# Patient Record
Sex: Male | Born: 1950 | Race: White | Hispanic: No | Marital: Married | Smoking: Never smoker
Health system: Southern US, Community
[De-identification: ages and names within clinical notes are randomized; demographics above are authoritative.]

## PROBLEM LIST (undated history)

## (undated) DIAGNOSIS — E119 Type 2 diabetes mellitus without complications: Secondary | ICD-10-CM

## (undated) DIAGNOSIS — M109 Gout, unspecified: Secondary | ICD-10-CM

## (undated) DIAGNOSIS — E7800 Pure hypercholesterolemia, unspecified: Secondary | ICD-10-CM

## (undated) DIAGNOSIS — I10 Essential (primary) hypertension: Secondary | ICD-10-CM

---

## 1988-11-15 HISTORY — PX: ANKLE CLOSED REDUCTION: SHX880

## 2001-11-15 DIAGNOSIS — Z9889 Other specified postprocedural states: Secondary | ICD-10-CM

## 2001-11-15 HISTORY — DX: Other specified postprocedural states: Z98.890

## 2001-11-15 HISTORY — PX: BACK SURGERY: SHX140

## 2021-03-27 ENCOUNTER — Other Ambulatory Visit: Payer: Self-pay | Admitting: Podiatry

## 2021-04-17 ENCOUNTER — Encounter (HOSPITAL_COMMUNITY): Payer: Self-pay

## 2021-04-17 NOTE — Patient Instructions (Addendum)
William Weiss  04/17/2021     @PREFPERIOPPHARMACY @   Your procedure is scheduled on  04/22/2021.   Report to 06/22/2021 at  [phone]  A.M.   Call this number if you have problems the morning of surgery:  [phone]   Remember:  Do not eat or drink after midnight.                        Take these medicines the morning of surgery with A SIP OF WATER  Allopurinol.  DO NOT take any medications for diabetes the morning of your procedure.    Please brush your teeth.  Do not wear jewelry, make-up or nail polish.  Do not wear lotions, powders, or perfumes, or deodorant.  Do not shave 48 hours prior to surgery.  Men may shave face and neck.  Do not bring valuables to the hospital.  [facility] is not responsible for any belongings or valuables.  Contacts, dentures or bridgework may not be worn into surgery.  Leave your suitcase in the car.  After surgery it may be brought to your room.  For patients admitted to the hospital, discharge time will be determined by your treatment team.  Patients discharged the day of surgery will not be allowed to drive home and musy have someone with them for 24 hours.   Special instructions:  DO NOT smoke tobacco or vape for 24 hours before your procedure.  Please read over the following fact sheets that you were given. Coughing and Deep Breathing, Surgical Site Infection Prevention, Anesthesia Post-op Instructions and Care and Recovery After Surgery       Surgical Wound Debridement, Care After This sheet gives you information about how to care for yourself after your procedure. Your health care provider may also give you more specific instructions. If you have problems or questions, contact your health care provider. What can I expect after the procedure? After the procedure, it is common to have:  Pain or soreness.  Fluid that leaks from the wound.  Stiffness.  A larger wound. This is because the dead tissue has been  removed. Follow these instructions at home: Medicines  Take over-the-counter and prescription medicines only as told by your health care provider.  If you were prescribed an antibiotic medicine, take it or apply it as told by your health care provider. Do not stop using the antibiotic even if you start to feel better. Wound care  Follow instructions from your health care provider about how to take care of your wound. Make sure you: ? Wash your hands with soap and water before and after you change your bandage (dressing). If soap and water are not available, use hand sanitizer. ? Change your dressing as told by your health care provider. ? If your dressing is dry and stuck when you try to remove it, moisten or wet the dressing with saline or water so that it can be removed without harming your skin or wound tissue.  Check your wound for signs of infection every time your dressing is changed. Have someone help you do this if you are not able. Watch for: ? More redness, swelling, or pain. ? More fluid or blood. ? Warmth. ? Pus. ? A bad smell coming from your wound even after you clean it.  Do not take baths, swim, or use a hot tub until your health care provider approves. Ask your health care provider if you  may take showers. You may only be allowed to take sponge baths. Activity  Do not drive or use heavy machinery while taking prescription pain medicine.  Return to your normal activities as told by your health care provider. Ask your health care provider what activities are safe for you. General instructions  Eat a healthy diet with lots of protein such as meats, cheese, nuts and beans. Ask your health care provider to suggest the best diet for you.  Do not use any products that contain nicotine or tobacco, such as cigarettes, e-cigarettes, and chewing tobacco. These can delay wound healing after surgery. If you need help quitting, ask your health care provider.  Keep all follow-up  visits as told by your health care provider. This is important.      Contact a health care provider if:  You have a fever.  Your pain medicine is not helping.  Your wound is more red and swollen.  You have increased bleeding.  You have pus coming from your wound.  You have a bad smell coming from your wound.  Your wound is not getting better after 1-2 weeks of treatment.  You develop a new medical condition, such as diabetes, peripheral vascular disease, or conditions that affect your defense system (immune system). Summary  After the procedure, it is common to have pain, soreness, stiffness, or fluid leaking from the wound.  Follow instructions from your health care provider about how to take care of your wound.  Check your wound for signs of infection every time your dressing is changed.  Eat a healthy diet with lots of protein. Ask your health care provider to suggest the best diet for you.  Contact a health care provider if you have fever, more swelling and redness, increased bleeding, or a bad smell from the wound. This information is not intended to replace advice given to you by your health care provider. Make sure you discuss any questions you have with your health care provider. Document Revised: 10/24/2018 Document Reviewed: 10/24/2018 Elsevier Patient Education  2021 [employer] How to Use Chlorhexidine for Bathing Chlorhexidine gluconate (CHG) is a germ-killing (antiseptic) solution that is used to clean the skin. It can get rid of the bacteria that normally live on the skin and can keep them away for about 24 hours. To clean your skin with CHG, you may be given:  A CHG solution to use in the shower or as part of a sponge bath.  A prepackaged cloth that contains CHG. Cleaning your skin with CHG may help lower the risk for infection:  While you are staying in the intensive care unit of the hospital.  If you have a vascular access, such as a central line, to  provide short-term or long-term access to your veins.  If you have a catheter to drain urine from your bladder.  If you are on a ventilator. A ventilator is a machine that helps you breathe by moving air in and out of your lungs.  After surgery. What are the risks? Risks of using CHG include:  A skin reaction.  Hearing loss, if CHG gets in your ears.  Eye injury, if CHG gets in your eyes and is not rinsed out.  The CHG product catching fire. Make sure that you avoid smoking and flames after applying CHG to your skin. Do not use CHG:  If you have a chlorhexidine allergy or have previously reacted to chlorhexidine.  On babies younger than 26 months of age. How  to use CHG solution  Use CHG only as told by your health care provider, and follow the instructions on the label.  Use the full amount of CHG as directed. Usually, this is one bottle. During a shower Follow these steps when using CHG solution during a shower (unless your health care provider gives you different instructions): 1. Start the shower. 2. Use your normal soap and shampoo to wash your face and hair. 3. Turn off the shower or move out of the shower stream. 4. Pour the CHG onto a clean washcloth. Do not use any type of brush or rough-edged sponge. 5. Starting at your neck, lather your body down to your toes. Make sure you follow these instructions: ? If you will be having surgery, pay special attention to the part of your body where you will be having surgery. Scrub this area for at least 1 minute. ? Do not use CHG on your head or face. If the solution gets into your ears or eyes, rinse them well with water. ? Avoid your genital area. ? Avoid any areas of skin that have broken skin, cuts, or scrapes. ? Scrub your back and under your arms. Make sure to wash skin folds. 6. Let the lather sit on your skin for 1-2 minutes or as long as told by your health care provider. 7. Thoroughly rinse your entire body in the  shower. Make sure that all body creases and crevices are rinsed well. 8. Dry off with a clean towel. Do not put any substances on your body afterward--such as powder, lotion, or perfume--unless you are told to do so by your health care provider. Only use lotions that are recommended by the manufacturer. 9. Put on clean clothes or pajamas. 10. If it is the night before your surgery, sleep in clean sheets.   During a sponge bath Follow these steps when using CHG solution during a sponge bath (unless your health care provider gives you different instructions): 1. Use your normal soap and shampoo to wash your face and hair. 2. Pour the CHG onto a clean washcloth. 3. Starting at your neck, lather your body down to your toes. Make sure you follow these instructions: ? If you will be having surgery, pay special attention to the part of your body where you will be having surgery. Scrub this area for at least 1 minute. ? Do not use CHG on your head or face. If the solution gets into your ears or eyes, rinse them well with water. ? Avoid your genital area. ? Avoid any areas of skin that have broken skin, cuts, or scrapes. ? Scrub your back and under your arms. Make sure to wash skin folds. 4. Let the lather sit on your skin for 1-2 minutes or as long as told by your health care provider. 5. Using a different clean, wet washcloth, thoroughly rinse your entire body. Make sure that all body creases and crevices are rinsed well. 6. Dry off with a clean towel. Do not put any substances on your body afterward--such as powder, lotion, or perfume--unless you are told to do so by your health care provider. Only use lotions that are recommended by the manufacturer. 7. Put on clean clothes or pajamas. 8. If it is the night before your surgery, sleep in clean sheets. How to use CHG prepackaged cloths  Only use CHG cloths as told by your health care provider, and follow the instructions on the label.  Use the CHG  cloth on  clean, dry skin.  Do not use the CHG cloth on your head or face unless your health care provider tells you to.  When washing with the CHG cloth: ? Avoid your genital area. ? Avoid any areas of skin that have broken skin, cuts, or scrapes. Before surgery Follow these steps when using a CHG cloth to clean before surgery (unless your health care provider gives you different instructions): 1. Using the CHG cloth, vigorously scrub the part of your body where you will be having surgery. Scrub using a back-and-forth motion for 3 minutes. The area on your body should be completely wet with CHG when you are done scrubbing. 2. Do not rinse. Discard the cloth and let the area air-dry. Do not put any substances on the area afterward, such as powder, lotion, or perfume. 3. Put on clean clothes or pajamas. 4. If it is the night before your surgery, sleep in clean sheets.   For general bathing Follow these steps when using CHG cloths for general bathing (unless your health care provider gives you different instructions). 1. Use a separate CHG cloth for each area of your body. Make sure you wash between any folds of skin and between your fingers and toes. Wash your body in the following order, switching to a new cloth after each step: ? The front of your neck, shoulders, and chest. ? Both of your arms, under your arms, and your hands. ? Your stomach and groin area, avoiding the genitals. ? Your right leg and foot. ? Your left leg and foot. ? The back of your neck, your back, and your buttocks. 2. Do not rinse. Discard the cloth and let the area air-dry. Do not put any substances on your body afterward--such as powder, lotion, or perfume--unless you are told to do so by your health care provider. Only use lotions that are recommended by the manufacturer. 3. Put on clean clothes or pajamas. Contact a health care provider if:  Your skin gets irritated after scrubbing.  You have questions about  using your solution or cloth. Get help right away if:  Your eyes become very red or swollen.  Your eyes itch badly.  Your skin itches badly and is red or swollen.  Your hearing changes.  You have trouble seeing.  You have swelling or tingling in your mouth or throat.  You have trouble breathing.  You swallow any chlorhexidine. Summary  Chlorhexidine gluconate (CHG) is a germ-killing (antiseptic) solution that is used to clean the skin. Cleaning your skin with CHG may help to lower your risk for infection.  You may be given CHG to use for bathing. It may be in a bottle or in a prepackaged cloth to use on your skin. Carefully follow your health care provider's instructions and the instructions on the product label.  Do not use CHG if you have a chlorhexidine allergy.  Contact your health care provider if your skin gets irritated after scrubbing. This information is not intended to replace advice given to you by your health care provider. Make sure you discuss any questions you have with your health care provider. Document Revised: 04/18/2020 Document Reviewed: 04/18/2020 Elsevier Patient Education  2021 [employer].

## 2021-04-20 ENCOUNTER — Other Ambulatory Visit (HOSPITAL_COMMUNITY): Payer: Self-pay | Admitting: Podiatry

## 2021-04-20 ENCOUNTER — Other Ambulatory Visit (HOSPITAL_COMMUNITY): Admission: RE | Admit: 2021-04-20 | Payer: Medicare Other | Source: Ambulatory Visit

## 2021-04-20 ENCOUNTER — Encounter (HOSPITAL_COMMUNITY): Payer: Self-pay

## 2021-04-20 ENCOUNTER — Encounter (HOSPITAL_COMMUNITY)
Admission: RE | Admit: 2021-04-20 | Discharge: 2021-04-20 | Disposition: A | Discharge status: Home / Self Care | Payer: Medicare Other | Source: Ambulatory Visit | Attending: Podiatry | Admitting: Podiatry

## 2021-04-20 ENCOUNTER — Ambulatory Visit (HOSPITAL_COMMUNITY)
Admission: RE | Admit: 2021-04-20 | Discharge: 2021-04-20 | Disposition: A | Discharge status: Home / Self Care | Payer: Medicare Other | Source: Ambulatory Visit | Attending: Podiatry | Admitting: Podiatry

## 2021-04-20 ENCOUNTER — Other Ambulatory Visit: Payer: Self-pay

## 2021-04-20 DIAGNOSIS — L97519 Non-pressure chronic ulcer of other part of right foot with unspecified severity: Secondary | ICD-10-CM

## 2021-04-20 DIAGNOSIS — Z01812 Encounter for preprocedural laboratory examination: Secondary | ICD-10-CM | POA: Insufficient documentation

## 2021-04-20 HISTORY — DX: Pure hypercholesterolemia, unspecified: E78.00

## 2021-04-20 HISTORY — DX: Type 2 diabetes mellitus without complications: E11.9

## 2021-04-20 HISTORY — DX: Essential (primary) hypertension: I10

## 2021-04-20 HISTORY — DX: Gout, unspecified: M10.9

## 2021-04-20 LAB — BASIC METABOLIC PANEL
Anion gap: 8 (ref 5–15)
BUN: 17 mg/dL (ref 8–23)
CO2: 28 mmol/L (ref 22–32)
Calcium: 8.8 mg/dL — ABNORMAL LOW (ref 8.9–10.3)
Chloride: 98 mmol/L (ref 98–111)
Creatinine, Ser: 1.14 mg/dL (ref 0.61–1.24)
GFR, Estimated: >60 mL/min (ref >60)
Glucose, Bld: 178 mg/dL — ABNORMAL HIGH (ref 70–99)
Potassium: 4 mmol/L (ref 3.5–5.1)
Sodium: 134 mmol/L — ABNORMAL LOW (ref 135–145)

## 2021-04-21 LAB — HEMOGLOBIN A1C
Hgb A1c MFr Bld: 6.5 % — ABNORMAL HIGH (ref 4.8–5.6)
Mean Plasma Glucose: 140 mg/dL

## 2021-04-22 ENCOUNTER — Ambulatory Visit (HOSPITAL_COMMUNITY): Payer: Medicare Other | Admitting: Anesthesiology

## 2021-04-22 ENCOUNTER — Encounter (HOSPITAL_COMMUNITY)
Admission: RE | Disposition: A | Discharge status: Home / Self Care | Payer: Self-pay | Source: Home / Self Care | Attending: Podiatry

## 2021-04-22 ENCOUNTER — Ambulatory Visit (HOSPITAL_COMMUNITY)
Admission: RE | Admit: 2021-04-22 | Discharge: 2021-04-22 | Disposition: A | Discharge status: Home / Self Care | Payer: Medicare Other | Attending: Podiatry | Admitting: Podiatry

## 2021-04-22 ENCOUNTER — Encounter (HOSPITAL_COMMUNITY): Payer: Self-pay

## 2021-04-22 ENCOUNTER — Ambulatory Visit (HOSPITAL_COMMUNITY): Payer: Medicare Other

## 2021-04-22 DIAGNOSIS — Z7984 Long term (current) use of oral hypoglycemic drugs: Secondary | ICD-10-CM | POA: Insufficient documentation

## 2021-04-22 DIAGNOSIS — E119 Type 2 diabetes mellitus without complications: Secondary | ICD-10-CM | POA: Diagnosis not present

## 2021-04-22 DIAGNOSIS — L97519 Non-pressure chronic ulcer of other part of right foot with unspecified severity: Secondary | ICD-10-CM | POA: Diagnosis present

## 2021-04-22 DIAGNOSIS — Z9889 Other specified postprocedural states: Secondary | ICD-10-CM

## 2021-04-22 DIAGNOSIS — I10 Essential (primary) hypertension: Secondary | ICD-10-CM | POA: Diagnosis not present

## 2021-04-22 HISTORY — PX: METATARSAL HEAD EXCISION: SHX5027

## 2021-04-22 LAB — GLUCOSE, CAPILLARY: Glucose-Capillary: 209 mg/dL — ABNORMAL HIGH (ref 70–99)

## 2021-04-22 SURGERY — EXCISION, METATARSAL BONE, HEAD
Anesthesia: General | Site: Toe | Laterality: Right

## 2021-04-22 MED ORDER — CEFAZOLIN SODIUM-DEXTROSE 2-4 GM/100ML-% IV SOLN: 2.0000 g | Freq: Once | INTRAVENOUS | Status: DC

## 2021-04-22 MED ORDER — PROPOFOL 500 MG/50ML IV EMUL
INTRAVENOUS | Status: DC | PRN
  Administered 2021-04-22: 50 ug/kg/min via INTRAVENOUS

## 2021-04-22 MED ORDER — BUPIVACAINE HCL (PF) 0.5 % IJ SOLN
INTRAMUSCULAR | Status: AC
  Filled 2021-04-22: qty 30

## 2021-04-22 MED ORDER — CEFAZOLIN IN SODIUM CHLORIDE 3-0.9 GM/100ML-% IV SOLN
3.0000 g | Freq: Once | INTRAVENOUS | Status: AC
  Administered 2021-04-22: 2 g via INTRAVENOUS

## 2021-04-22 MED ORDER — CHLORHEXIDINE GLUCONATE 0.12 % MT SOLN: 15.0000 mL | Freq: Once | OROMUCOSAL | Status: AC

## 2021-04-22 MED ORDER — FENTANYL CITRATE (PF) 100 MCG/2ML IJ SOLN
INTRAMUSCULAR | Status: AC
  Filled 2021-04-22: qty 2

## 2021-04-22 MED ORDER — PROPOFOL 10 MG/ML IV BOLUS
INTRAVENOUS | Status: DC | PRN
  Administered 2021-04-22 (×3): 20 mg via INTRAVENOUS
  Administered 2021-04-22: 40 mg via INTRAVENOUS
  Administered 2021-04-22: 20 mg via INTRAVENOUS

## 2021-04-22 MED ORDER — PROPOFOL 10 MG/ML IV BOLUS
INTRAVENOUS | Status: AC
  Filled 2021-04-22: qty 40

## 2021-04-22 MED ORDER — MEPERIDINE HCL 50 MG/ML IJ SOLN: 6.2500 mg | INTRAMUSCULAR | Status: DC | PRN

## 2021-04-22 MED ORDER — LACTATED RINGERS IV SOLN: INTRAVENOUS | Status: DC

## 2021-04-22 MED ORDER — ORAL CARE MOUTH RINSE: 15.0000 mL | Freq: Once | OROMUCOSAL | Status: AC

## 2021-04-22 MED ORDER — CEFAZOLIN SODIUM-DEXTROSE 2-4 GM/100ML-% IV SOLN
INTRAVENOUS | Status: AC
  Filled 2021-04-22: qty 100

## 2021-04-22 MED ORDER — FENTANYL CITRATE (PF) 100 MCG/2ML IJ SOLN: 25.0000 ug | INTRAMUSCULAR | Status: DC | PRN

## 2021-04-22 MED ORDER — ONDANSETRON HCL 4 MG/2ML IJ SOLN
4.0000 mg | Freq: Once | INTRAMUSCULAR | Status: DC | PRN
Start: 2021-04-22 — End: 2021-04-22

## 2021-04-22 MED ORDER — MIDAZOLAM HCL 2 MG/2ML IJ SOLN
INTRAMUSCULAR | Status: AC
  Filled 2021-04-22: qty 2

## 2021-04-22 MED ORDER — BUPIVACAINE HCL (PF) 0.5 % IJ SOLN
INTRAMUSCULAR | Status: DC | PRN
  Administered 2021-04-22: 10 mL

## 2021-04-22 MED ORDER — PROPOFOL 500 MG/50ML IV EMUL: INTRAVENOUS | Status: DC | PRN

## 2021-04-22 MED ORDER — LIDOCAINE HCL (PF) 2 % IJ SOLN
INTRAMUSCULAR | Status: AC
  Filled 2021-04-22: qty 5

## 2021-04-22 MED ORDER — CHLORHEXIDINE GLUCONATE 0.12 % MT SOLN
OROMUCOSAL | Status: AC
  Administered 2021-04-22: 15 mL via OROMUCOSAL
  Filled 2021-04-22: qty 15

## 2021-04-22 MED ORDER — 0.9 % SODIUM CHLORIDE (POUR BTL) OPTIME
TOPICAL | Status: DC | PRN
  Administered 2021-04-22: 1000 mL

## 2021-04-22 SURGICAL SUPPLY — 44 items
APL SKNCLS STERI-STRIP NONHPOA (GAUZE/BANDAGES/DRESSINGS) ×1
BANDAGE CONFORM 2  STR LF (GAUZE/BANDAGES/DRESSINGS) ×2 IMPLANT
BANDAGE ELASTIC 4 VELCRO NS (GAUZE/BANDAGES/DRESSINGS) ×2 IMPLANT
BANDAGE ESMARK 4X12 BL STRL LF (DISPOSABLE) ×1 IMPLANT
BENZOIN TINCTURE PRP APPL 2/3 (GAUZE/BANDAGES/DRESSINGS) ×2 IMPLANT
BLADE AVERAGE 25X9 (BLADE) ×2 IMPLANT
BLADE SURG 15 STRL LF DISP TIS (BLADE) ×1 IMPLANT
BLADE SURG 15 STRL SS (BLADE) ×2
BNDG CMPR 12X4 ELC STRL LF (DISPOSABLE) ×1
BNDG CMPR STD VLCR NS LF 5.8X4 (GAUZE/BANDAGES/DRESSINGS) ×1
BNDG CONFORM 2 STRL LF (GAUZE/BANDAGES/DRESSINGS) ×2 IMPLANT
BNDG ELASTIC 4X5.8 VLCR NS LF (GAUZE/BANDAGES/DRESSINGS) ×2 IMPLANT
BNDG ESMARK 4X12 BLUE STRL LF (DISPOSABLE) ×2
BNDG GAUZE ELAST 4 BULKY (GAUZE/BANDAGES/DRESSINGS) ×2 IMPLANT
CLOTH BEACON ORANGE TIMEOUT ST (SAFETY) ×2 IMPLANT
COVER LIGHT HANDLE STERIS (MISCELLANEOUS) ×4 IMPLANT
COVER WAND RF STERILE (DRAPES) ×2 IMPLANT
CUFF TOURN SGL QUICK 18X4 (TOURNIQUET CUFF) ×2 IMPLANT
DECANTER SPIKE VIAL GLASS SM (MISCELLANEOUS) ×2 IMPLANT
DRAPE OEC MINIVIEW 54X84 (DRAPES) ×2 IMPLANT
DRSG ADAPTIC 3X8 NADH LF (GAUZE/BANDAGES/DRESSINGS) ×2 IMPLANT
ELECT REM PT RETURN 9FT ADLT (ELECTROSURGICAL) ×2
ELECTRODE REM PT RTRN 9FT ADLT (ELECTROSURGICAL) ×1 IMPLANT
GAUZE SPONGE 4X4 12PLY STRL (GAUZE/BANDAGES/DRESSINGS) ×4 IMPLANT
GLOVE SURG ENC MOIS LTX SZ7.5 (GLOVE) ×2 IMPLANT
GLOVE SURG LTX SZ7 (GLOVE) ×2 IMPLANT
GLOVE SURG UNDER POLY LF SZ7 (GLOVE) ×4 IMPLANT
GLOVE SURG UNDER POLY LF SZ7.5 (GLOVE) ×2 IMPLANT
GOWN STRL REUS W/ TWL LRG LVL3 (GOWN DISPOSABLE) ×1 IMPLANT
GOWN STRL REUS W/TWL LRG LVL3 (GOWN DISPOSABLE) ×4 IMPLANT
KIT TURNOVER KIT A (KITS) ×2 IMPLANT
MANIFOLD NEPTUNE II (INSTRUMENTS) ×2 IMPLANT
NEEDLE HYPO 27GX1-1/4 (NEEDLE) ×4 IMPLANT
NS IRRIG 1000ML POUR BTL (IV SOLUTION) ×2 IMPLANT
PACK BASIC LIMB (CUSTOM PROCEDURE TRAY) ×2 IMPLANT
PAD ARMBOARD 7.5X6 YLW CONV (MISCELLANEOUS) ×2 IMPLANT
SET BASIN LINEN APH (SET/KITS/TRAYS/PACK) ×2 IMPLANT
SOL PREP PROV IODINE SCRUB 4OZ (MISCELLANEOUS) ×2 IMPLANT
SPONGE LAP 18X18 RF (DISPOSABLE) ×2 IMPLANT
STRIP CLOSURE SKIN 1/2X4 (GAUZE/BANDAGES/DRESSINGS) ×2 IMPLANT
SUT PROLENE 4 0 PS 2 18 (SUTURE) ×2 IMPLANT
SUT VIC AB 4-0 PS2 27 (SUTURE) ×2 IMPLANT
SUT VICRYL AB 3-0 FS1 BRD 27IN (SUTURE) ×2 IMPLANT
SYR CONTROL 10ML LL (SYRINGE) ×4 IMPLANT

## 2021-04-22 NOTE — Brief Op Note (Signed)
04/22/2021  8:35 AM  PATIENT:  William Weiss  70 y.o. 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 2) Debridement of the ulcer right foot.    SURGEON:  Surgeon(s) and Role:    * Jenaya Saar, Dance movement psychotherapist, DPM - Primary  PHYSICIAN ASSISTANT: none.   ASSISTANTS: none.  ANESTHESIA:   local, IV sedation and MAC  EBL:  0 mL   BLOOD ADMINISTERED:none  DRAINS: none   LOCAL MEDICATIONS USED:  MARCAINE    and Amount: 10 ml  SPECIMEN:  Source of Specimen:  right foot fifth metatarsal head   DISPOSITION OF SPECIMEN:  PATHOLOGY  COUNTS:  YES  TOURNIQUET:   Total Tourniquet Time Documented: Ankle (Right) - 35 minutes Total: Ankle (Right) - 35 minutes   DICTATION: .Viviann Spare Dictation  PLAN OF CARE: Discharge to home after PACU  PATIENT DISPOSITION:  PACU - hemodynamically stable.   Delay start of Pharmacological VTE agent (>24hrs) due to surgical blood loss or risk of bleeding: not applicable

## 2021-04-22 NOTE — Transfer of Care (Signed)
Immediate Anesthesia Transfer of Care Note  Patient: William Weiss  Procedure(s) Performed: RIGHT 5TH METATARSAL HEAD RESECTION WITH DEBRIDEMENT OF ULCER RIGHT FOOT (Right Toe)  Patient Location: PACU  Anesthesia Type:MAC  Level of Consciousness: awake  Airway & Oxygen Therapy: Patient Spontanous Breathing  Post-op Assessment: Report given to RN  Post vital signs: Reviewed and stable  Last Vitals:  Vitals Value Taken Time  BP 126/73 04/22/21 0840  Temp    Pulse 67 04/22/21 0842  Resp 27 04/22/21 0842  SpO2 100 % 04/22/21 0842  Vitals shown include unvalidated device data.  Last Pain:  Vitals:   04/22/21 0654  PainSc: 0-No pain      Patients Stated Pain Goal: 5 (69/50/72 2575)  Complications: No complications documented.

## 2021-04-22 NOTE — Anesthesia Postprocedure Evaluation (Signed)
Anesthesia Post Note  Patient: William Weiss  Procedure(s) Performed: RIGHT 5TH METATARSAL HEAD RESECTION WITH DEBRIDEMENT OF ULCER RIGHT FOOT (Right Toe)  Patient location during evaluation: PACU Anesthesia Type: General Level of consciousness: awake and alert Pain management: pain level controlled Vital Signs Assessment: post-procedure vital signs reviewed and stable Respiratory status: spontaneous breathing Cardiovascular status: blood pressure returned to baseline and stable Postop Assessment: no apparent nausea or vomiting Anesthetic complications: no   No complications documented.   Last Vitals:  Vitals:   04/22/21 0910 04/22/21 0918  BP: (!) 143/77 132/71  Pulse: 70 66  Resp: 18 18  Temp:  36.8 C  SpO2: 99% 100%    Last Pain:  Vitals:   04/22/21 0918  TempSrc: Oral  PainSc: 0-No pain                 Wilber Fini

## 2021-04-22 NOTE — Discharge Instructions (Signed)
These instructions will give you an idea of what to expect after surgery and how to manage issues that may arise before your first post op office visit.  Pain Management Pain is best managed by "staying ahead" of it. If pain gets out of control, it is difficult to get it back under control. Local anesthesia that lasts 6-8 hours is used to numb the foot and decrease pain.  For the best pain control, take the pain medication every 4 hours for the first 2 days post op. On the third day pain medication can be taken as needed.   Post Op Nausea Nausea is common after surgery, so it is managed proactively.  If prescribed, use the prescribed nausea medication regularly for the first 2 days post op.  Bandages Do not worry if there is blood on the bandage. What looks like a lot of blood on the bandage is actually a small amount. Blood on the dressing spreads out as it is absorbed by the gauze, the same way a drop of water spreads out on a paper towel.  If the bandages feel wet or dry, stiff and uncomfortable, call the office during office hours and we will schedule a time for you to have the bandage changed.  Unless you are specifically told otherwise, we will do the first bandage change in the office.  Keep your bandage dry. If the bandage becomes wet or soiled, notify the office and we will schedule a time to change the bandage.  Activity It is best to spend most of the first 2 days after surgery lying down with the foot elevated above the level of your heart. You may put weight on your heel while wearing the surgical shoe.   You may only get up to go to the restroom.  Driving Do not drive until you are able to respond in an emergency (i.e. slam on the brakes). This usually occurs after the bone has healed - 6 to 8 weeks.  Call the Office If you have a fever over 101F.  If you have increasing pain after the initial post op pain has settled down.  If you have increasing redness, swelling, or  drainage.  If you have any questions or concerns.      Monitored Anesthesia Care, Care After This sheet gives you information about how to care for yourself after your procedure. Your health care provider may also give you more specific instructions. If you have problems or questions, contact your health care provider. What can I expect after the procedure? After the procedure, it is common to have:  Tiredness.  Forgetfulness about what happened after the procedure.  Impaired judgment for important decisions.  Nausea or vomiting.  Some difficulty with balance. Follow these instructions at home: For the time period you were told by your health care provider:  Rest as needed.  Do not participate in activities where you could fall or become injured.  Do not drive or use machinery.  Do not drink alcohol.  Do not take sleeping pills or medicines that cause drowsiness.  Do not make important decisions or sign legal documents.  Do not take care of children on your own.      Eating and drinking  Follow the diet that is recommended by your health care provider.  Drink enough fluid to keep your urine pale yellow.  If you vomit: ? Drink water, juice, or soup when you can drink without vomiting. ? Make sure you have little or   no nausea before eating solid foods. General instructions  Have a responsible adult stay with you for the time you are told. It is important to have someone help care for you until you are awake and alert.  Take over-the-counter and prescription medicines only as told by your health care provider.  If you have sleep apnea, surgery and certain medicines can increase your risk for breathing problems. Follow instructions from your health care provider about wearing your sleep device: ? Anytime you are sleeping, including during daytime naps. ? While taking prescription pain medicines, sleeping medicines, or medicines that make you drowsy.  Avoid  smoking.  Keep all follow-up visits as told by your health care provider. This is important. Contact a health care provider if:  You keep feeling nauseous or you keep vomiting.  You feel light-headed.  You are still sleepy or having trouble with balance after 24 hours.  You develop a rash.  You have a fever.  You have redness or swelling around the IV site. Get help right away if:  You have trouble breathing.  You have new-onset confusion at home. Summary  For several hours after your procedure, you may feel tired. You may also be forgetful and have poor judgment.  Have a responsible adult stay with you for the time you are told. It is important to have someone help care for you until you are awake and alert.  Rest as told. Do not drive or operate machinery. Do not drink alcohol or take sleeping pills.  Get help right away if you have trouble breathing, or if you suddenly become confused. This information is not intended to replace advice given to you by your health care provider. Make sure you discuss any questions you have with your health care provider. Document Revised: 07/17/2020 Document Reviewed: 10/04/2019 Elsevier Patient Education  2021 Elsevier Inc.  

## 2021-04-22 NOTE — H&P (Signed)
.   HISTORY AND PHYSICAL INTERVAL NOTE:  04/22/2021  7:12 AM  William Weiss  has presented today for surgery, with the diagnosis of CHRONIC ULCER OF RIGHT SUB 5TH METATARSAL HEAD.  The various methods of treatment have been discussed with the patient.  No guarantees were given.  After consideration of risks, benefits and other options for treatment, the patient has consented to surgery.  I have reviewed the patients' chart and labs.    Patient Vitals for the past 24 hrs:  BP Temp Pulse Resp SpO2  04/22/21 0654 131/75 98.6 F (37 C) 68 18 99 %    A history and physical examination was performed in my office.  The patient was reexamined.  There have been no changes to this history and physical examination.  Erskine Emery, DPM

## 2021-04-22 NOTE — Progress Notes (Signed)
Verbally verified with CRNA Glynn Octave patient received Ancef during anesthesia.

## 2021-04-22 NOTE — Op Note (Signed)
04/22/2021  8:35 AM  PATIENT:  William Weiss  70 y.o. 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 2) Debridement of the ulcer right foot.    SURGEON:  Surgeon(s) and Role:    * Patel, Prayashkumar, DPM - Primary  PHYSICIAN ASSISTANT: none.   ASSISTANTS: none.  ANESTHESIA:   local, IV sedation and MAC  EBL:  0 mL   LOCAL MEDICATIONS USED: 0.5% MARCAINE  and Amount: 10 ml  SPECIMEN:  Source of Specimen:  right foot fifth metatarsal head   DISPOSITION OF SPECIMEN:  PATHOLOGY  MATERIALS: 3-0 Vicryl, 4-0 Vicryl. 4-0 Prolene.   TOURNIQUET:   Total Tourniquet Time Documented: Ankle (Right) - 35 minutes Total: Ankle (Right) - 35 minutes  Patient was brought into the operating room laid supine on the operating table. Ankle tourniquet was applied to the surgical extremity. Following IV sedation, a local block was achieved using 10 cc of 0.5% marcaine plain. The foot was the prepped, scrubbed and draped in aseptic manner. Using an esmarch band the tourniquet on the surgical site was inflatted at 250mmHG.   Attention was directed towards the right foot. There is chronic ulceration noted on the sub right 5th met head plantar aspect. 3cm dorsal linear incision was made over the right fifth MPJ and fifth metatarsal, down to skin and subcutaneous tissue. All the neurovascular bundles were retracted. At this time using #15 blade, capsule was incised. The fifth met head was freed up using freever elevator. There was some erosive changes noted to the fifth met head. Using a Sagittal saw, the head was resected and dissected out. The fifth met head was sent for pathology. The wound was irrigated with normal saline. No sharp edges noted on the bone.  At this time deep wound cultures were obtained. The capsule was closed using 3-0 Vicryl.  Subcutaneous tissue was closed using 4-0 Vicryl. The skin was closed using 4-0 Prolene. The ulcer on the plantar aspect of the foot was debrided using #15 blade. All nonviable tissue was removed. The ulcer was measured to be 1.4x0.9x0.2 post debridement. Betadine dressing applied. The tourniquet was deflated. Capillary refill time was brisk to all lesser digits. Patient was transferred to PACU with VSS.  

## 2021-04-22 NOTE — Anesthesia Preprocedure Evaluation (Signed)
Anesthesia Evaluation  Patient identified by MRN, date of birth, ID band Patient awake    Reviewed: Allergy & Precautions, NPO status , Patient's Chart, lab work & pertinent test results  History of Anesthesia Complications Negative for: history of anesthetic complications  Airway Mallampati: III  TM Distance: >3 FB Neck ROM: Full    Dental  (+) Dental Advisory Given, Missing   Pulmonary neg pulmonary ROS,    Pulmonary exam normal breath sounds clear to auscultation       Cardiovascular Exercise Tolerance: Good hypertension, Pt. on medications Normal cardiovascular exam Rhythm:Regular Rate:Normal     Neuro/Psych negative neurological ROS  negative psych ROS   GI/Hepatic negative GI ROS, Neg liver ROS,   Endo/Other  diabetes, Poorly Controlled, Type 2, Oral Hypoglycemic AgentsMorbid obesity  Renal/GU negative Renal ROS     Musculoskeletal negative musculoskeletal ROS (+)   Abdominal   Peds  Hematology negative hematology ROS (+)   Anesthesia Other Findings   Reproductive/Obstetrics negative OB ROS                             Anesthesia Physical Anesthesia Plan  ASA: III  Anesthesia Plan: General   Post-op Pain Management:    Induction: Intravenous  PONV Risk Score and Plan: TIVA  Airway Management Planned: Nasal Cannula, Natural Airway and Simple Face Mask  Additional Equipment:   Intra-op Plan:   Post-operative Plan:   Informed Consent: I have reviewed the patients History and Physical, chart, labs and discussed the procedure including the risks, benefits and alternatives for the proposed anesthesia with the patient or authorized representative who has indicated his/her understanding and acceptance.       Plan Discussed with: CRNA and Surgeon  Anesthesia Plan Comments:         Anesthesia Quick Evaluation

## 2021-04-23 ENCOUNTER — Encounter (HOSPITAL_COMMUNITY): Payer: Self-pay | Admitting: Podiatry

## 2021-04-23 LAB — GLUCOSE, CAPILLARY: Glucose-Capillary: 191 mg/dL — ABNORMAL HIGH (ref 70–99)

## 2021-04-24 LAB — SURGICAL PATHOLOGY

## 2021-04-27 LAB — AEROBIC/ANAEROBIC CULTURE W GRAM STAIN (SURGICAL/DEEP WOUND): Gram Stain: NONE SEEN

## 2022-05-13 IMAGING — DX DG FOOT COMPLETE 3+V*R*
3 series · 3 of 3 positions shown · non-contrast
Comparison: None.

CLINICAL DATA: Ulcer of right foot, unspecified ulcer stage (HCC)

EXAM:
RIGHT FOOT COMPLETE - 3+ VIEW

[foot ap]
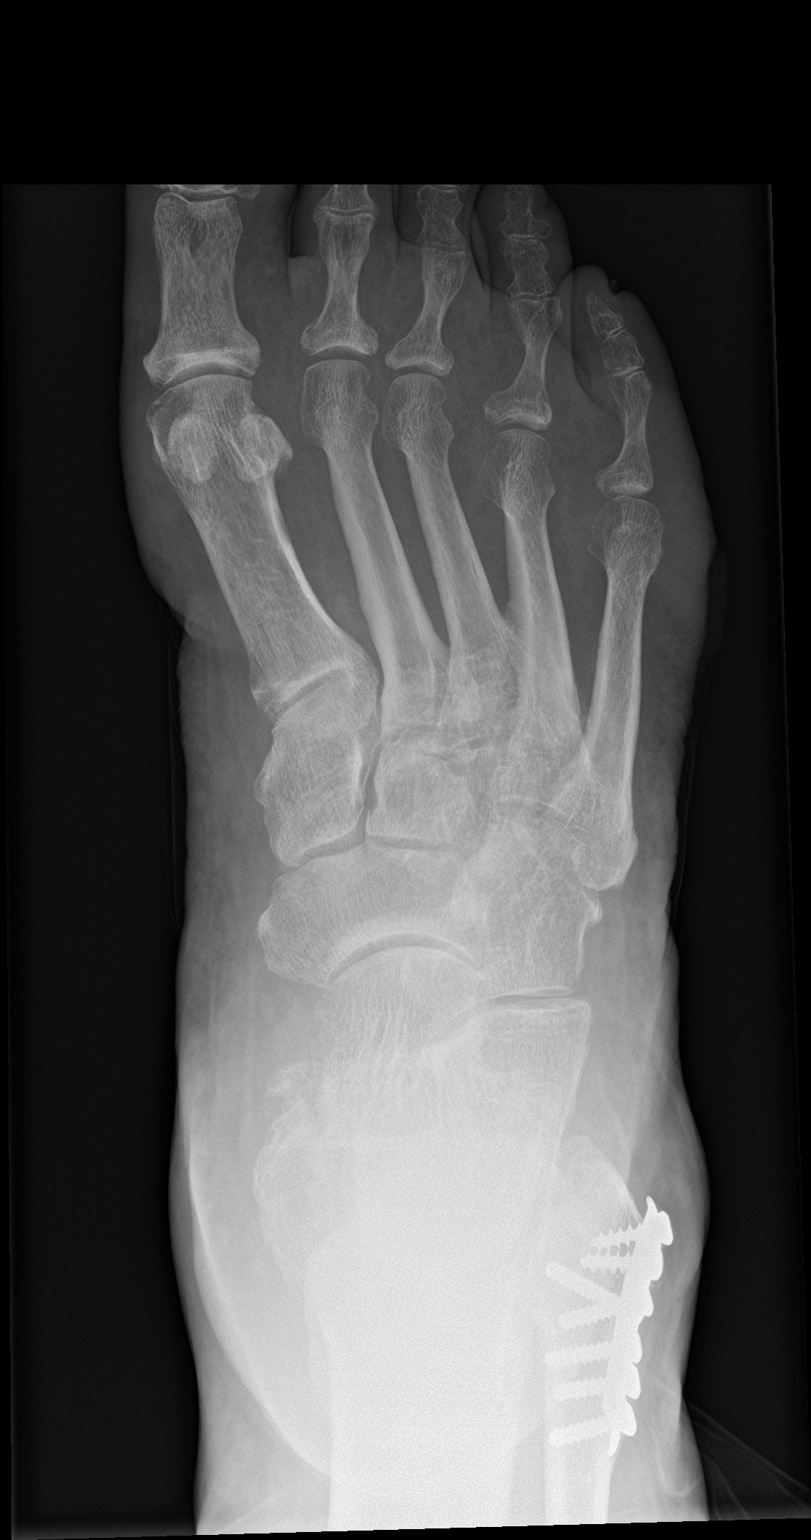

[foot obl]
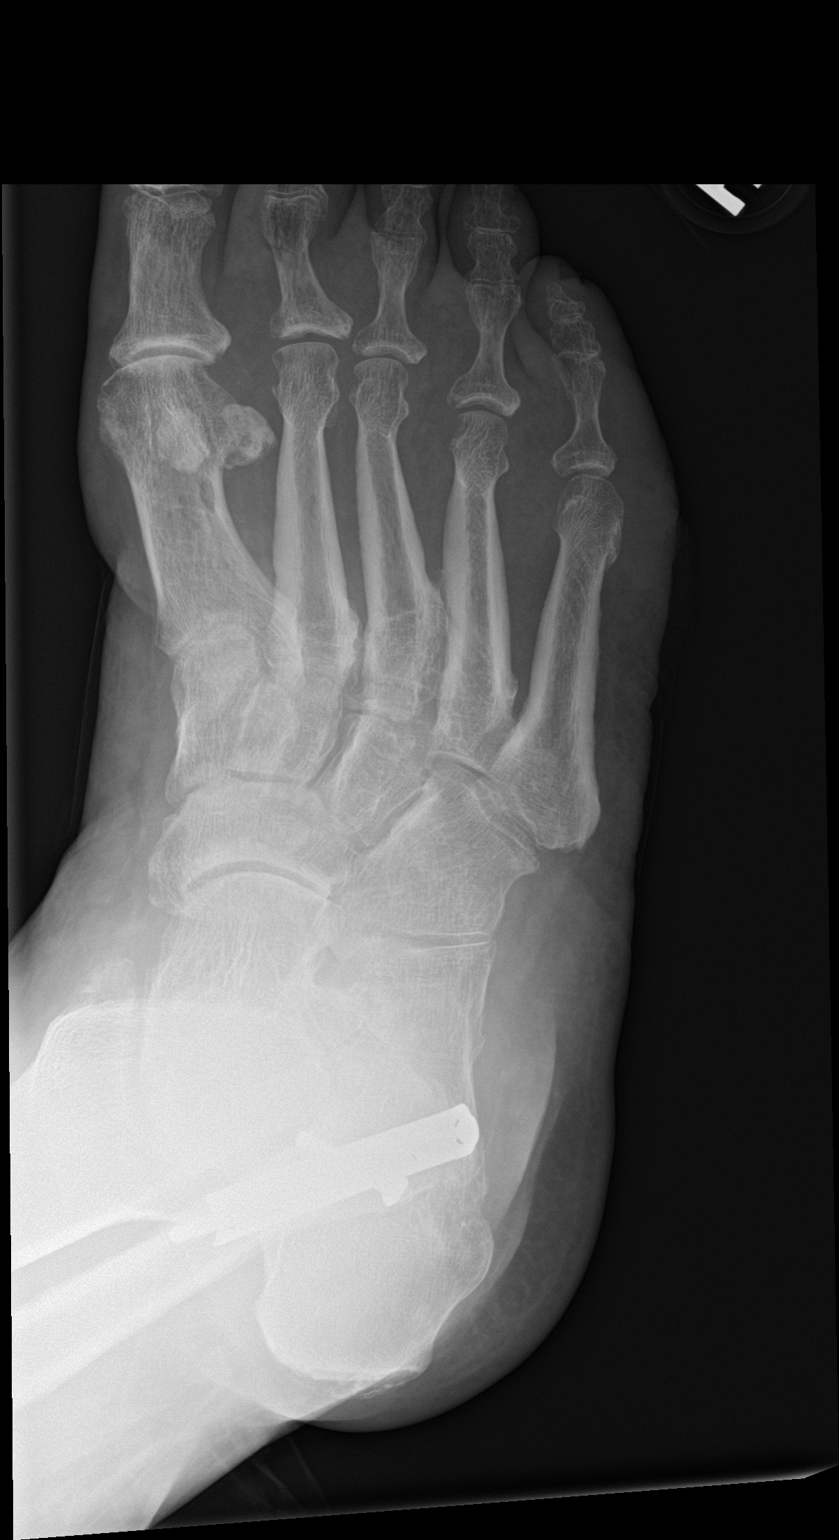

[foot lat]
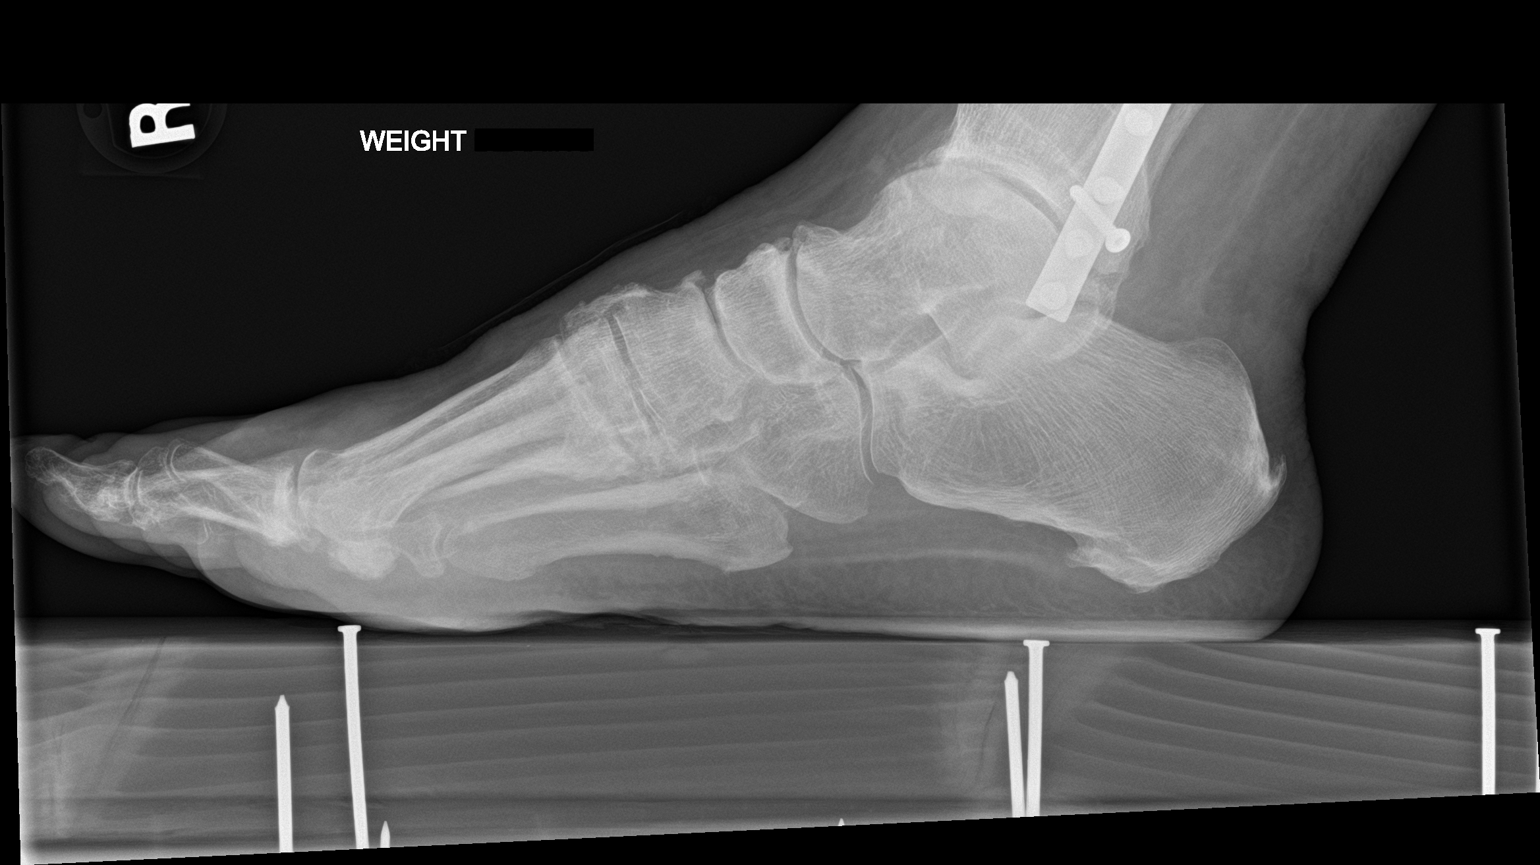

[3 of 3 positions shown; findings below may reference images not displayed]

FINDINGS: Prior plate and screw fixation of the distal fibula. There is mild
tibiotalar and midfoot degenerative change. Small plantar calcaneal
spur and Achilles enthesophyte. There is no evidence of acute
fracture. There is mild interphalangeal joint degenerative change.
No evidence of osseous erosion or destruction.
IMPRESSION: No radiographic evidence of osteomyelitis.

## 2022-05-15 IMAGING — DX DG FOOT COMPLETE 3+V*R*
3 series · 3 of 3 positions shown · non-contrast
Comparison: Plain films right foot 04/20/2021.

CLINICAL DATA: Patient status post debridement of an ulceration on
the lateral right foot and resection of the right fifth metatarsal
head today.

EXAM:
RIGHT FOOT COMPLETE - 3+ VIEW

[foot ap]
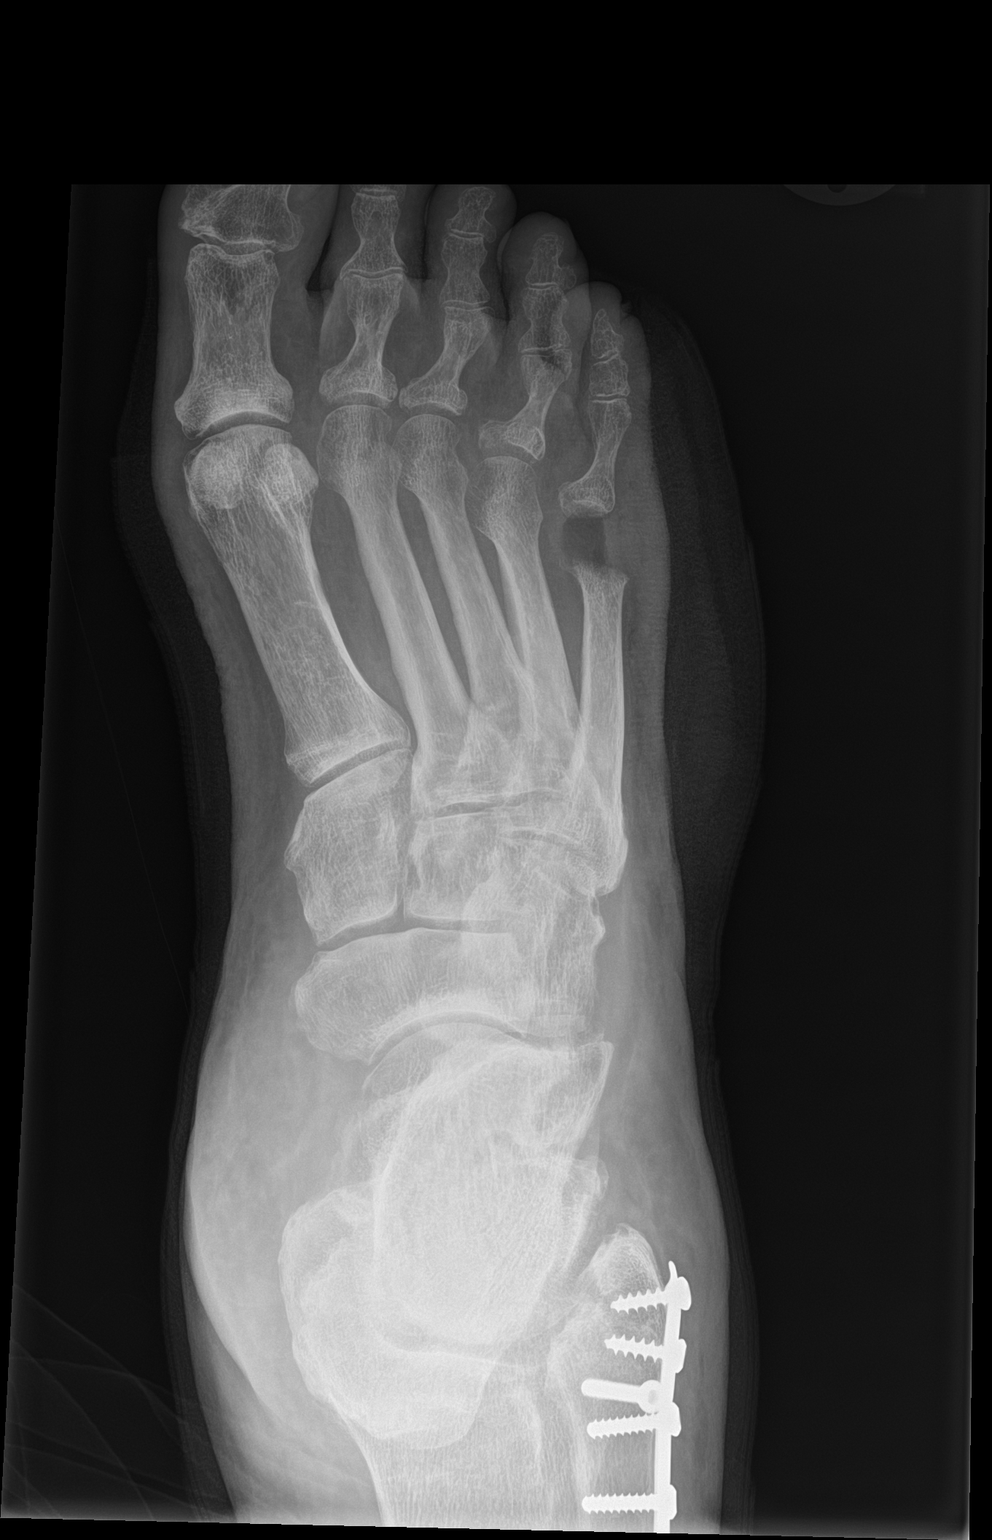

[foot obl]
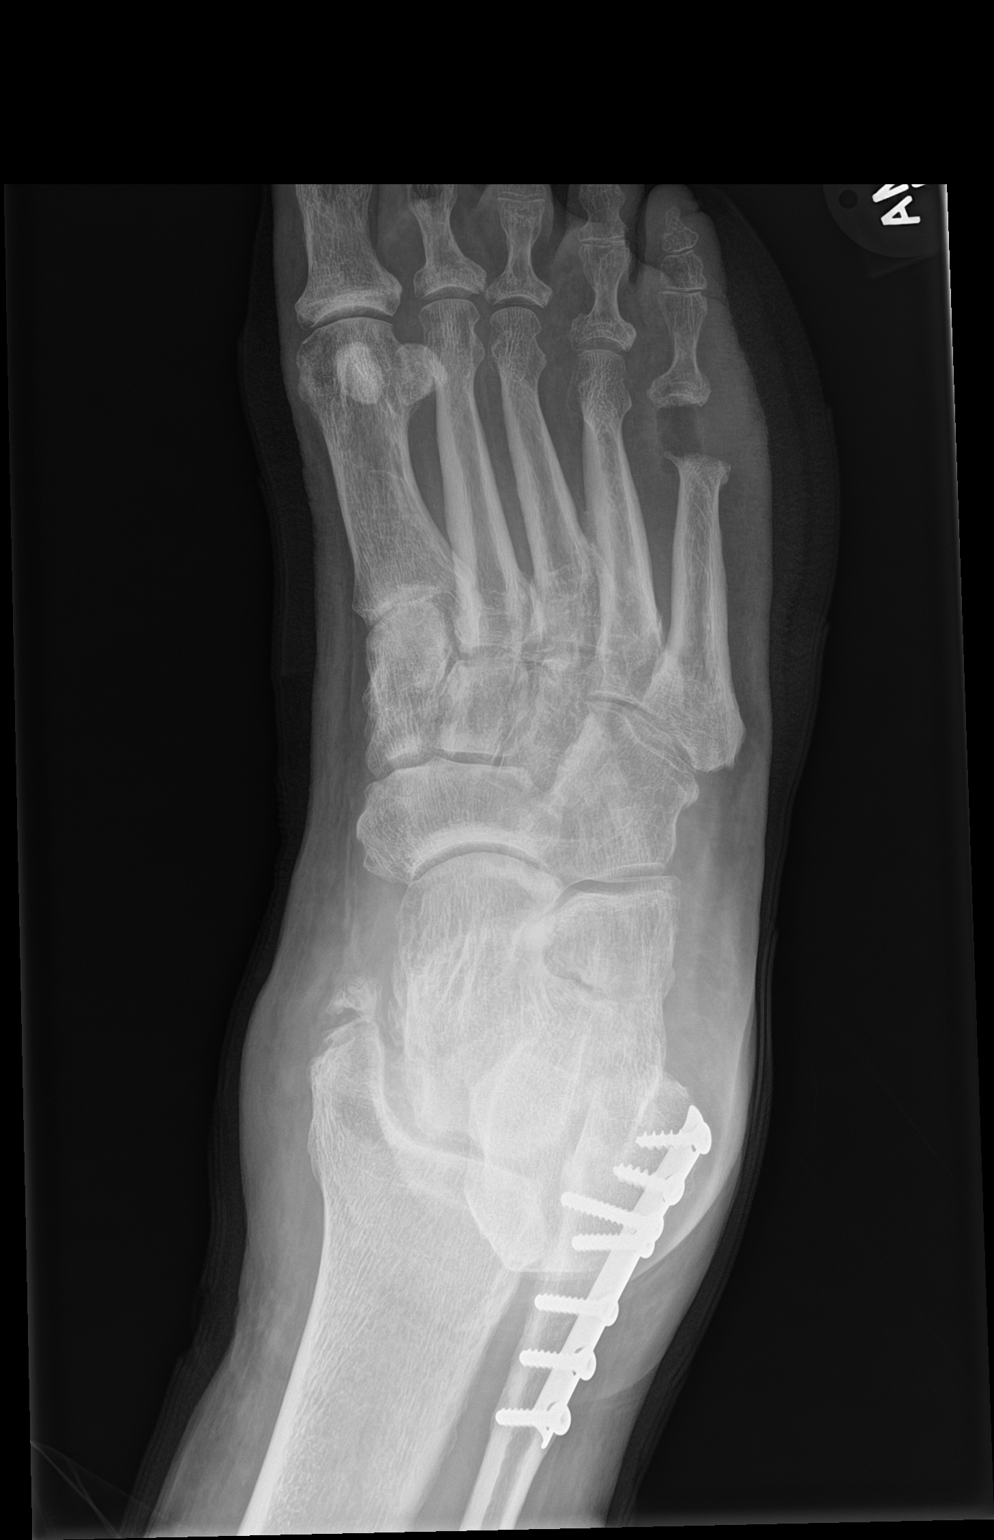

[foot lat]
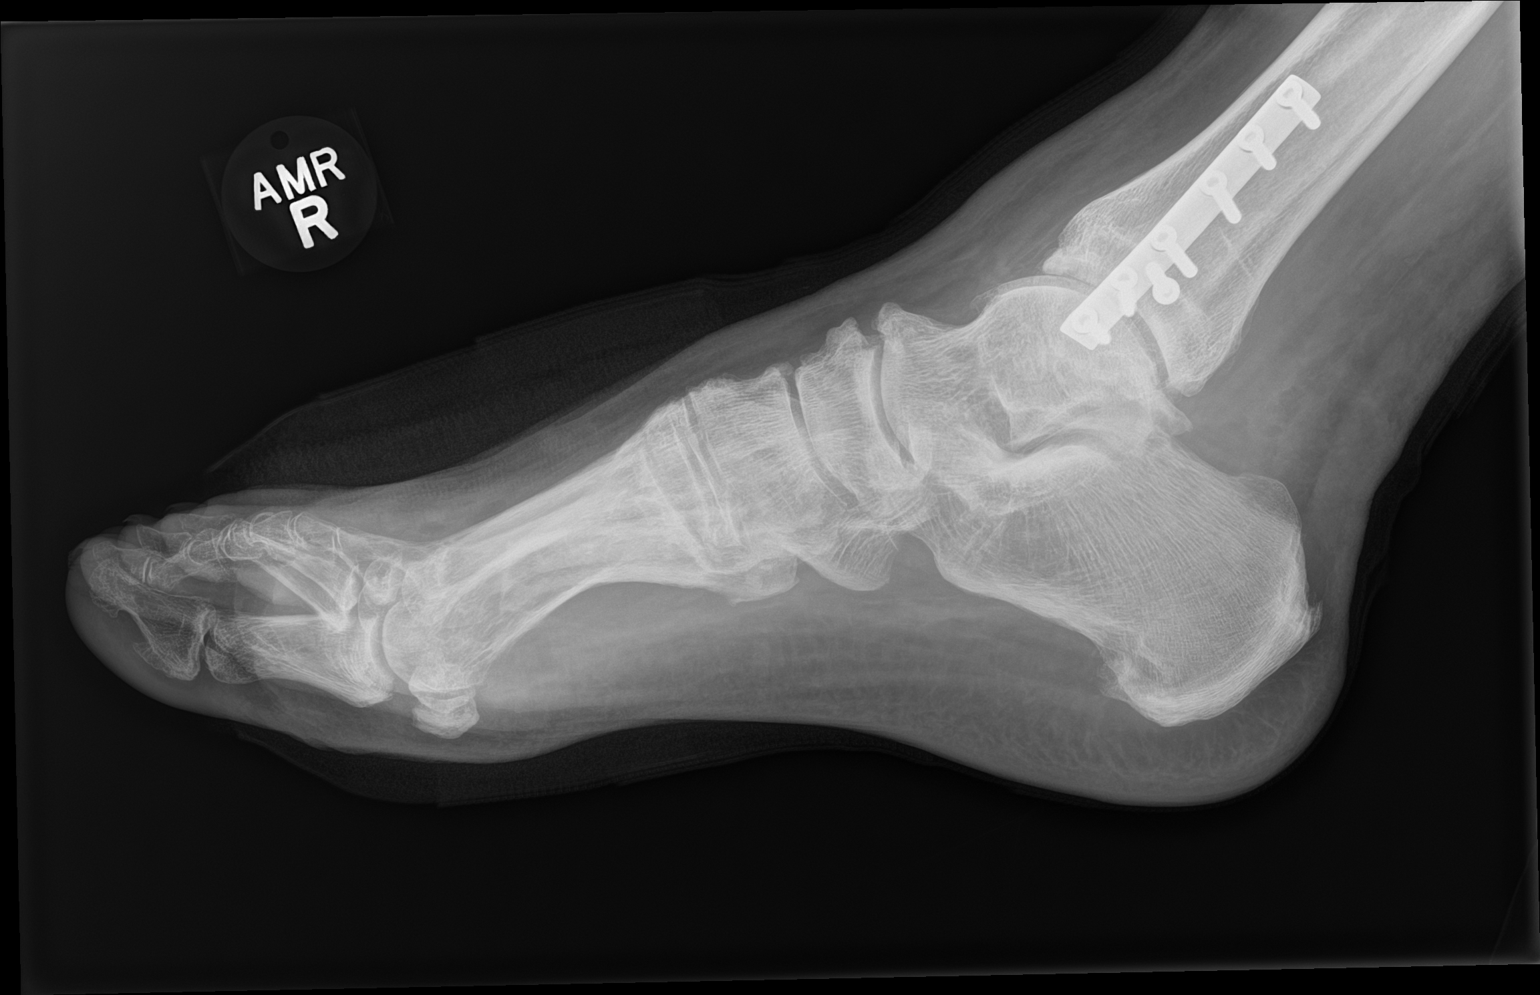

[3 of 3 positions shown; findings below may reference images not displayed]

FINDINGS: The fifth metatarsal head has been resected. Bandaging is in place.
No acute bony or joint abnormality is seen. No unexpected radiopaque
foreign body. Plate and screw fixation of a remote distal fibular
fracture noted.
IMPRESSION: Status post resection of the fifth metatarsal head. No acute
finding.
